# Patient Record
Sex: Male | Born: 2000 | Race: Black or African American | Hispanic: No | Marital: Single | Smoking: Never smoker
Health system: Southern US, Community
[De-identification: ages and names within clinical notes are randomized; demographics above are authoritative.]

## PROBLEM LIST (undated history)

## (undated) DIAGNOSIS — J45909 Unspecified asthma, uncomplicated: Secondary | ICD-10-CM

## (undated) HISTORY — PX: TYMPANOSTOMY TUBE PLACEMENT: SHX32

## (undated) HISTORY — PX: TYMPANOPLASTY: SHX33

---
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
## 2022-02-01 ENCOUNTER — Ambulatory Visit (HOSPITAL_COMMUNITY)
Admission: EM | Admit: 2022-02-01 | Discharge: 2022-02-01 | Disposition: A | Discharge status: Home / Self Care | Payer: Medicaid Other | Attending: Emergency Medicine | Admitting: Emergency Medicine

## 2022-02-01 ENCOUNTER — Other Ambulatory Visit: Payer: Self-pay

## 2022-02-01 ENCOUNTER — Encounter (HOSPITAL_COMMUNITY): Payer: Self-pay | Admitting: *Deleted

## 2022-02-01 DIAGNOSIS — M25562 Pain in left knee: Secondary | ICD-10-CM

## 2022-02-01 MED ORDER — IBUPROFEN 800 MG PO TABS: 800.0000 mg | ORAL_TABLET | Freq: Three times a day (TID) | ORAL | 0 refills | Status: DC

## 2022-02-01 NOTE — ED Triage Notes (Signed)
Pt reports knee pain LT. Pt ha san appt with ortho on Friday but wants to be seen now. ?

## 2022-02-01 NOTE — ED Provider Notes (Addendum)
?[facility] ? ? ?MRN: [id number] DOB: [birth year] ? ?Subjective:  ? ?Chief Complaint;  ?Chief Complaint  ?Patient presents with  ? Knee Pain  ? ? ?Michael Davies is a 21 y.o. male presenting for left knee pain.  There was no precipitating event however patient several years ago did have a partial tear of his ACL.  Over the last couple of days he has had sharp shooting pains from his knee into his anterior calf he denies instability. ? ?No current facility-administered medications for this encounter. ? ?Current Outpatient Medications:  ?  albuterol (PROVENTIL HFA;VENTOLIN HFA) 108 (90 Base) MCG/ACT inhaler, Inhale 1-2 puffs into the lungs every 6 (six) hours as needed for wheezing or shortness of breath., Disp: 1 Inhaler, Rfl: 0 ?  beclomethasone (QVAR) 40 MCG/ACT inhaler, Inhale into the lungs 2 (two) times daily., Disp: , Rfl:  ?  cetirizine (ZYRTEC) 10 MG tablet, Take 10 mg by mouth daily., Disp: , Rfl:   ? ?No Known Allergies ? ?Past Medical History:  ?Diagnosis Date  ? Asthma   ?  ? ?Review of Systems  ?All other systems reviewed and are negative. ? ? ?Objective:  ? ?Vitals: ?BP (!) 90/56   Pulse 66   Temp 98.6 ?F (37 ?C)   Resp 20   SpO2 98%  ? ?Physical Exam ?Vitals and nursing note reviewed.  ?Constitutional:   ?   General: He is not in acute distress. ?   Appearance: He is well-developed.  ?HENT:  ?   Head: Normocephalic and atraumatic.  ?Eyes:  ?   Conjunctiva/sclera: Conjunctivae normal.  ?Cardiovascular:  ?   Rate and Rhythm: Normal rate and regular rhythm.  ?   Heart sounds: No murmur heard. ?Pulmonary:  ?   Effort: Pulmonary effort is normal. No respiratory distress.  ?   Breath sounds: Normal breath sounds.  ?Abdominal:  ?   Palpations: Abdomen is soft.  ?   Tenderness: There is no abdominal tenderness.  ?Musculoskeletal:     ?   General: Tenderness present. No swelling.  ?   Cervical back: Neck supple.  ?   Comments: Left knee with lateral tenderness to palpation, no gross  swelling, ballottement, deformity.  Full range of motion, no NVD D, McMurray and drawer are negative.  ?Skin: ?   General: Skin is warm and dry.  ?   Capillary Refill: Capillary refill takes less than 2 seconds.  ?Neurological:  ?   Mental Status: He is alert.  ?Psychiatric:     ?   Mood and Affect: Mood normal.  ? ? ?No results found for this or any previous visit (from the past 24 hour(s)). ? ?No results found.  ?  ? ?Assessment and Plan :  ? ?1. Acute pain of left knee   ? ? ?No orders of the defined types were placed in this encounter. ? ? ?MDM:  ?Michael Davies is a 21 y.o. male presenting for left knee pain.  He had previous ACL tear but increased pain over the last few days without new trauma.  His physical exam is unremarkable except for mild tenderness to the lateral aspect.  Patient is encouraged to use Motrin 3 times a day and use some support on the ligament.  He has follow-up with Ortho this month for consideration PT and/or MRI.  I discussed treatment, follow up and return instructions. Questions were answered. Patient stated understanding of instructions and is stable for discharge. ? ?Elnita Maxwell  Renard Matter FNP-C MCN  ?  ?Jone Baseman, NP ?02/01/22 1705 ? ?  ?Jone Baseman, NP ?02/01/22 1706 ? ?

## 2022-02-01 NOTE — Discharge Instructions (Addendum)
Take Motrin as directed 3 times daily and use support on the left knee.  Follow-up with orthopedics as scheduled for further evaluation if not improving for consideration MRI versus physical therapy. ?

## 2022-02-03 ENCOUNTER — Encounter: Payer: Self-pay | Admitting: Physician Assistant

## 2022-02-03 ENCOUNTER — Ambulatory Visit (INDEPENDENT_AMBULATORY_CARE_PROVIDER_SITE_OTHER): Payer: Medicaid Other | Admitting: Physician Assistant

## 2022-02-03 ENCOUNTER — Other Ambulatory Visit: Payer: Self-pay

## 2022-02-03 ENCOUNTER — Ambulatory Visit (INDEPENDENT_AMBULATORY_CARE_PROVIDER_SITE_OTHER): Payer: Medicaid Other

## 2022-02-03 DIAGNOSIS — M25562 Pain in left knee: Secondary | ICD-10-CM

## 2022-02-03 DIAGNOSIS — G8929 Other chronic pain: Secondary | ICD-10-CM

## 2022-02-03 NOTE — Progress Notes (Addendum)
Office Visit Note   Patient: Michael Davies           Date of Birth: 12/18/2000           MRN: [id number] Visit Date: 02/03/2022              Requested by: [facility] 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  He states that when he was a freshman in high school he had a significant injury to his left knee.  He had a big effusion and thinks he "dislocated his knee "an MRI 5 years ago demonstrated per his report partial tearing of the ACL and a lateral meniscus tear.  Because of his age he went through conservative treatment and continues to go through conservative treatment including physical therapy exercises and bracing.  He now states the knee pain especially laterally is getting much worse.  His knee feels unstable.  And he has a palpable popping when he bends and extends his knee.  He would like to be more active but cannot be because of this knee.  He believes he was initially treated at [facility] orth[facility]  Assessment & Plan: Visit Diagnoses:  1. Chronic pain of left knee     Plan: 21 year old with history of knee instability and by report meniscus tear.  It was decided at the time surgery would not be appropriate because of his age.  He has had increasing pain and mechanical system such as locking and catching in the knee.  Exam coincides with this.  He also is beginning to get more pain and instability.  I have recommended an MRI of the left knee and follow-up with Dr. August Saucer.  Again he has failed conservative treatment including physical therapy continued exercise anti-inflammatories and bracing  Follow-Up Instructions: No follow-ups on file.   Ortho Exam  Patient is alert, oriented, no adenopathy, well-dressed, normal affect, normal respiratory effort. Examination of his left knee he  does have slight increased swelling compared to the right knee.  No significant effusion no redness no cellulitis.  With range of motion he has painful popping on the lateral side of the joint line.  Some tenderness around the patellofemoral joint though most everything is focused laterally good varus valgus stability.  Has a fairly good endpoint on anterior draw and compared to the unaffected side.  Distal CMS is intact  Imaging: XR KNEE 3 VIEW LEFT  Result Date: 02/03/2022 Radiographs of his left knee were reviewed today well-maintained alignment and joint spacing no acute osseous injuries.  He does have a small loose body off the patella but this is not acute.  No images are attached to the encounter.  Labs: Lab Results  Component Value Date   REPTSTATUS 11/20/2016 FINAL 11/17/2016   CULT NO GROUP A STREP (S.PYOGENES) ISOLATED 11/17/2016     No results found for: ALBUMIN, PREALBUMIN, CBC  No results found for: MG No results found for: VD25OH  No results found for: PREALBUMIN No flowsheet data found.   There is no height or weight on file to calculate BMI.  Orders:  Orders Placed This Encounter  Procedures   XR KNEE 3 VIEW LEFT   MR Knee Left w/o contrast   Ambulatory referral to Orthopedic Surgery   No orders of the  defined types were placed in this encounter.    Procedures: No procedures performed  Clinical Data: No additional findings.  ROS:  All other systems negative, except as noted in the HPI. Review of Systems  Objective: Vital Signs: There were no vitals taken for this visit.  Specialty Comments:  No specialty comments available.  PMFS History: There are no problems to display for this patient.  Past Medical History:  Diagnosis Date   Asthma     No family history on file.  Past Surgical History:  Procedure Laterality Date   TYMPANOPLASTY     TYMPANOSTOMY TUBE PLACEMENT     Social History   Occupational History   Not on file  Tobacco Use    Smoking status: Never   Smokeless tobacco: Never  Substance and Sexual Activity   Alcohol use: No   Drug use: Not on file   Sexual activity: Not on file

## 2022-02-17 ENCOUNTER — Telehealth: Payer: Self-pay | Admitting: Physician Assistant

## 2022-02-17 NOTE — Telephone Encounter (Signed)
Contacted patient his MRI of his knee was denied giving his past history and his locking and swelling in his knee I do think it is medically necessary.  He does have a follow-up appointment with Dr. August Saucer on Monday and I asked that he keep this so Dr. August Saucer can evaluate his knee ?

## 2022-02-18 ENCOUNTER — Other Ambulatory Visit: Payer: Medicaid Other

## 2022-02-20 ENCOUNTER — Ambulatory Visit: Payer: Medicaid Other | Admitting: Orthopedic Surgery

## 2022-04-18 ENCOUNTER — Telehealth: Payer: Self-pay | Admitting: *Deleted

## 2022-04-18 NOTE — Telephone Encounter (Signed)
Pt called stating he had received authorization from his insurance and would like to proceed with getting MRI, I told pt I never received auth from insurance and asked if he could bring me a copy to review and try to get scheduled with imaging.  ? ?Pt brought in the authorizatoin form I looked over it and noticed the validity dates were from 02/03/22-03/05/22, I advised pt I will have to contact [employer] to see if I could get it extended.  ? ?I called pt insurance I was informed the approval turnaround is still in prospective and once it gets approved on their end then I could go in and change the DOS if needed or I could withdraw the approval and start again. I got online to see if I could restart a new case and it shows pt is no longer eligible in their system.  ? ?I will wait a couple weeks and try again to see if can get the valid dates changed. Number to call [phone] ? ?Call reference to this case [id number] ?

## 2022-05-12 ENCOUNTER — Ambulatory Visit
Admission: RE | Admit: 2022-05-12 | Discharge: 2022-05-12 | Disposition: A | Discharge status: Home / Self Care | Payer: Medicaid Other | Source: Ambulatory Visit | Attending: Physician Assistant | Admitting: Physician Assistant

## 2022-05-12 DIAGNOSIS — M25562 Pain in left knee: Secondary | ICD-10-CM

## 2022-05-12 NOTE — Telephone Encounter (Signed)
Pt has been approved

## 2022-05-15 ENCOUNTER — Other Ambulatory Visit: Payer: Self-pay | Admitting: Physician Assistant

## 2022-06-05 ENCOUNTER — Ambulatory Visit (INDEPENDENT_AMBULATORY_CARE_PROVIDER_SITE_OTHER): Payer: Medicaid Other | Admitting: Orthopedic Surgery

## 2022-06-05 DIAGNOSIS — G8929 Other chronic pain: Secondary | ICD-10-CM | POA: Diagnosis not present

## 2022-06-05 DIAGNOSIS — M25562 Pain in left knee: Secondary | ICD-10-CM | POA: Diagnosis not present

## 2022-06-10 ENCOUNTER — Encounter: Payer: Self-pay | Admitting: Orthopedic Surgery

## 2022-06-10 NOTE — Progress Notes (Signed)
Office Visit Note   Patient: Michael Davies           Date of Birth: 2001/01/04           MRN: [id number] Visit Date: 06/05/2022 Requested by: [facility] [address] [place],  [place] [address] PCP: [facility]  Subjective: Chief Complaint  Patient presents with   Left Knee - Pain    Scan review    HPI: Patient is a 21 year old who presents for evaluation of left knee pain.  He has had pain for several years.  Describes prior patellar dislocation and meniscal tear with no surgery done.  Pain comes and goes and is worse with activity.  Sometimes he has no pain.  The knee never swells.  Patient reports some popping in the front and the back of the knee.  He states the pain at times is difficult to bear.  Hard for him to play basketball.  Most of the time when he is symptomatic it occurs on that lateral side.  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  MRI scan of the left knee from June 2023 is reviewed.  It does show some signal in the medial meniscus with no discrete tear.  Also signal in the lateral meniscus with possible degenerative tear.  He may have a focal full-thickness cartilage defect with no subchondral marrow edema on the lateral femoral condyle.  Again these findings are somewhat underwhelming on review of the images.              ROS: All systems reviewed are negative as they relate to the chief complaint within the history of present illness.  Patient denies  fevers or chills.   Assessment & Plan: Visit Diagnoses:  1. Chronic pain of left knee     Plan: Impression is mild left knee pain with some degenerative signal in the meniscus laterally which may or may not reach the articular surface.  Small chondral defect also noted which has no reactive subchondral marrow edema.  He never has any swelling in the knee.  I think he does have some pain which may or may not be related to these MRI findings.   Whether or not arthroscopic intervention would be predictably helpful to get him back to 100% which is what his desire is is difficult to say.  I think it is unlikely.  However if he wants to proceed with surgical intervention that remains an option but he is going to hold off for now.  We will see him back as needed.  Follow-Up Instructions: Return if symptoms worsen or fail to improve.   Orders:  No orders of the defined types were placed in this encounter.  No orders of the defined types were placed in this encounter.     Procedures: No procedures performed   Clinical Data: No additional findings.  Objective: Vital Signs: There were no vitals taken for this visit.  Physical Exam:   Constitutional: Patient appears well-developed HEENT:  Head: Normocephalic Eyes:EOM are normal Neck: Normal range of motion Cardiovascular: Normal rate Pulmonary/chest: Effort normal Neurologic: Patient is alert Skin: Skin is warm Psychiatric: Patient has normal mood and affect   Ortho Exam: Ortho exam demonstrates full active and passive range of motion of the left knee.  There is no lateral or medial joint line tenderness.  No patellar apprehension.  Collateral crucial ligaments are stable.  Range  of motion is full.  Alignment intact.  No patellofemoral crepitus.  McMurray compression testing is negative for both sides.  Specialty Comments:  No specialty comments available.  Imaging: No results found.   PMFS History: There are no problems to display for this patient.  Past Medical History:  Diagnosis Date   Asthma     History reviewed. No pertinent family history.  Past Surgical History:  Procedure Laterality Date   TYMPANOPLASTY     TYMPANOSTOMY TUBE PLACEMENT     Social History   Occupational History   Not on file  Tobacco Use   Smoking status: Never   Smokeless tobacco: Never  Substance and Sexual Activity   Alcohol use: No   Drug use: Not on file   Sexual  activity: Not on file

## 2023-02-10 IMAGING — MR MR KNEE*L* W/O CM
4 of 7 series · 22 of 40 positions shown · non-contrast
Comparison: Left knee radiographs 02/03/2022

CLINICAL DATA: Chronic left knee pain and weakness. Evaluate for
meniscal injury.

EXAM:
MRI OF THE LEFT KNEE WITHOUT CONTRAST
TECHNIQUE: Multiplanar, multisequence MR imaging of the knee was performed. No
intravenous contrast was administered.

[Series 3: T2 fat-sat · axial · 4.0mm · 0.62mm/px · z∈[-88,+57]mm · 6 of 30 slices shown]
[im 1/30]
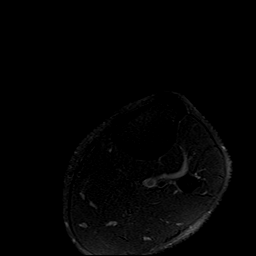
[im 6/30]
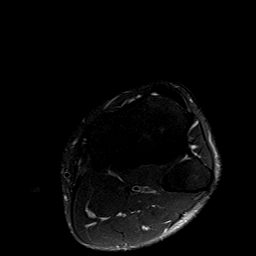
[im 12/30]
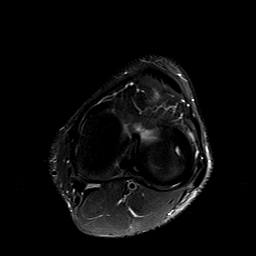
[im 18/30]
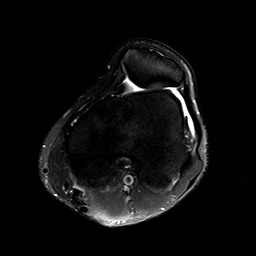
[im 24/30]
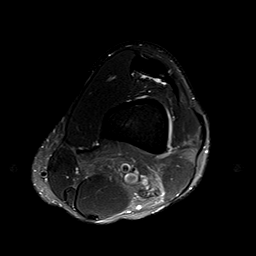
[im 30/30]
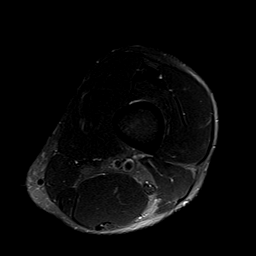

[Series 6: PD fat-sat · coronal · 3.0mm · 0.29mm/px · 7 of 32 slices shown (1 of 3)]
[im 1/32]
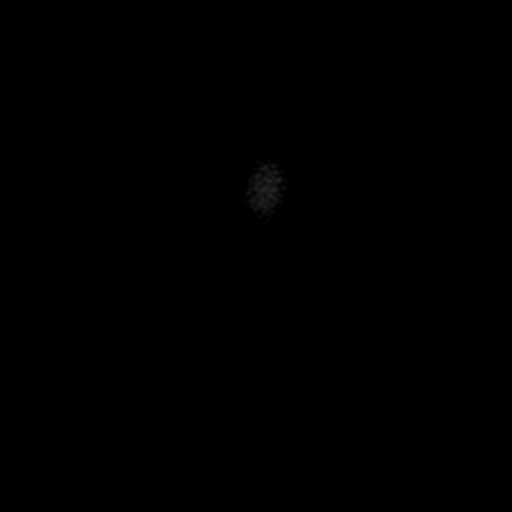
[im 6/32]
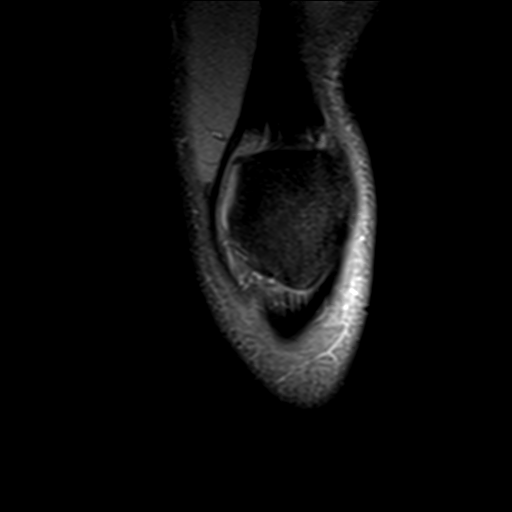
[im 11/32]
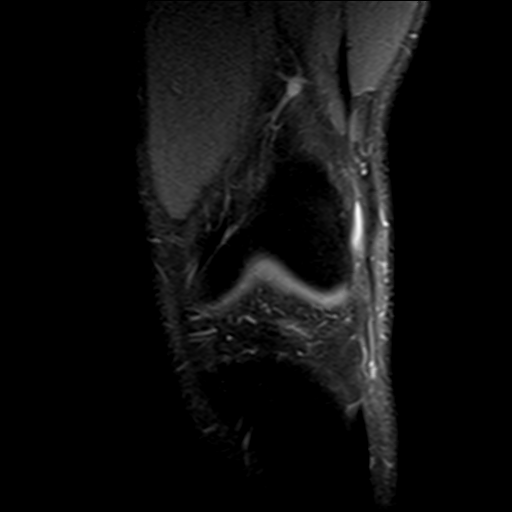
[im 16/32]
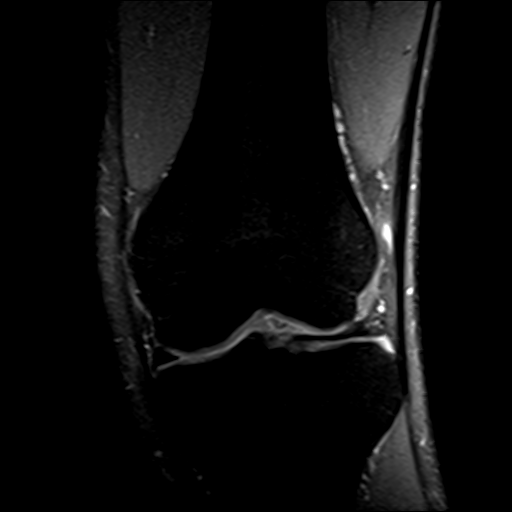
[im 21/32]
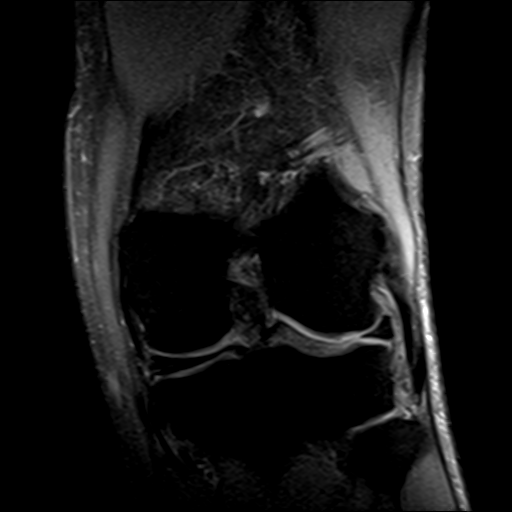
[im 26/32]
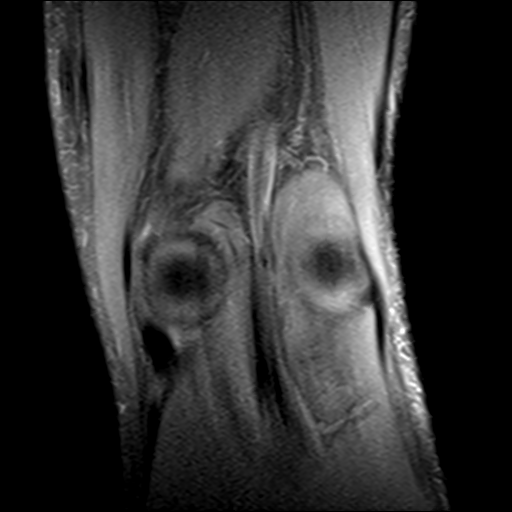
[im 32/32]
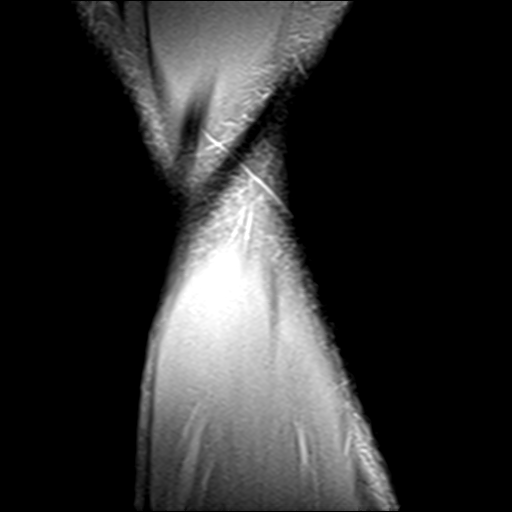

[Series 8: PD fat-sat · sagittal · 3.0mm · 0.29mm/px · 6 of 29 slices shown (2 of 3)]
[im 1/29]
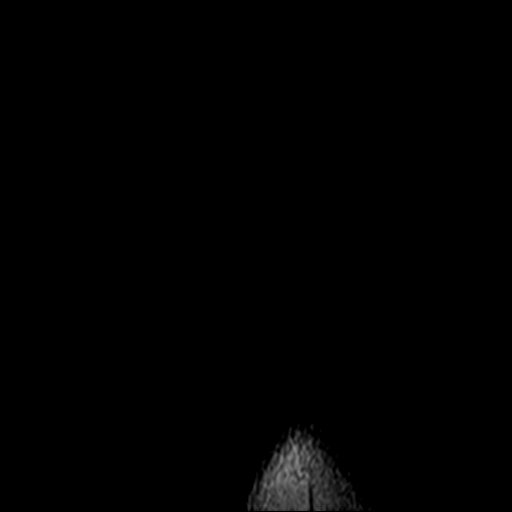
[im 6/29]
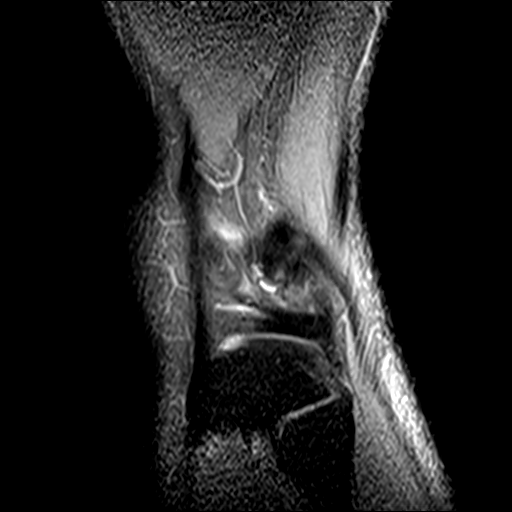
[im 12/29]
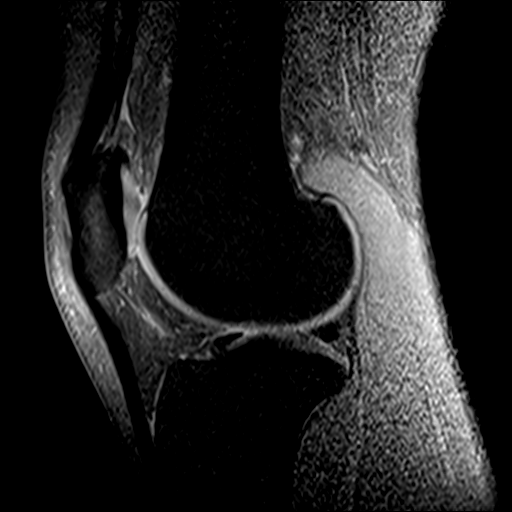
[im 17/29]
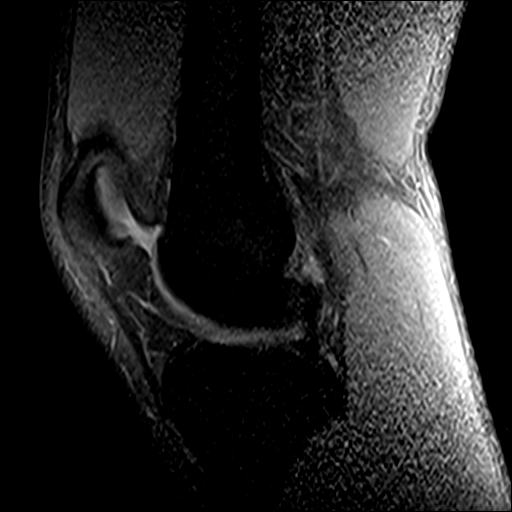
[im 23/29]
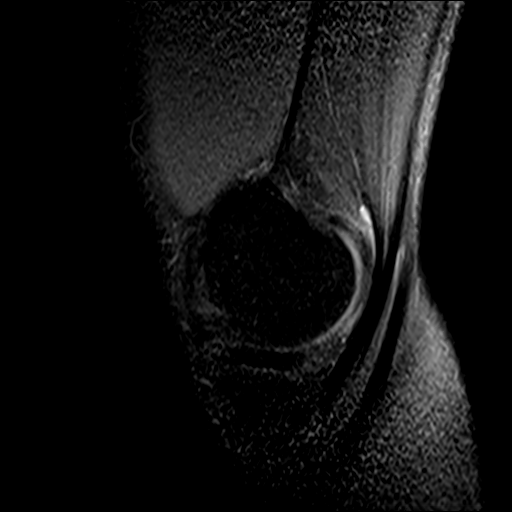
[im 29/29]
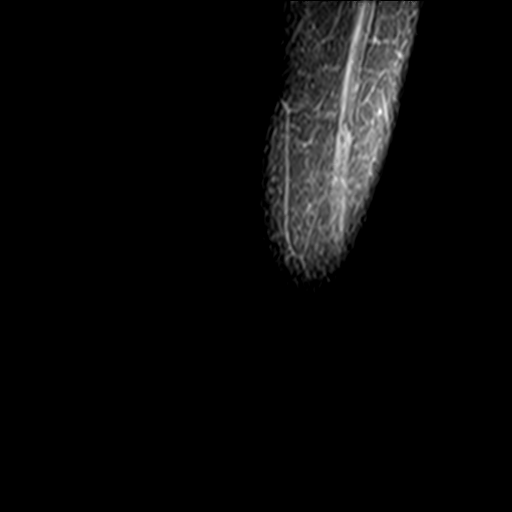

[Series 9: PD fat-sat · coronal · 2.3mm · 0.29mm/px · 3 of 12 slices shown (3 of 3)]
[im 1/12]
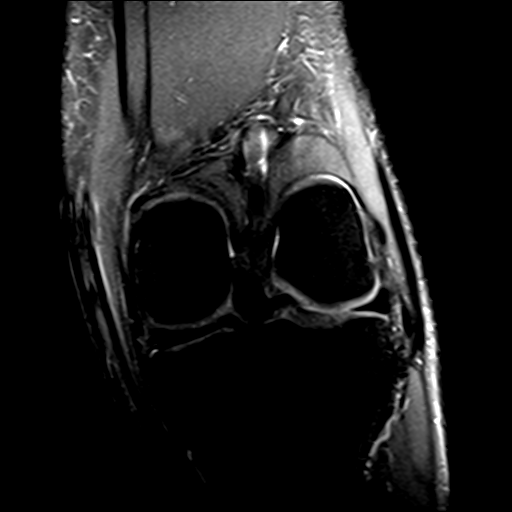
[im 6/12]
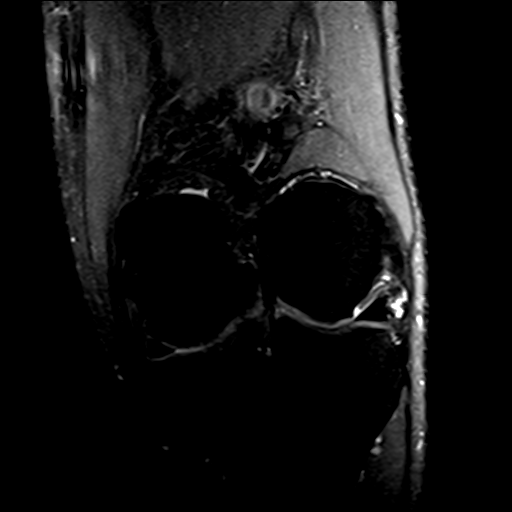
[im 12/12]
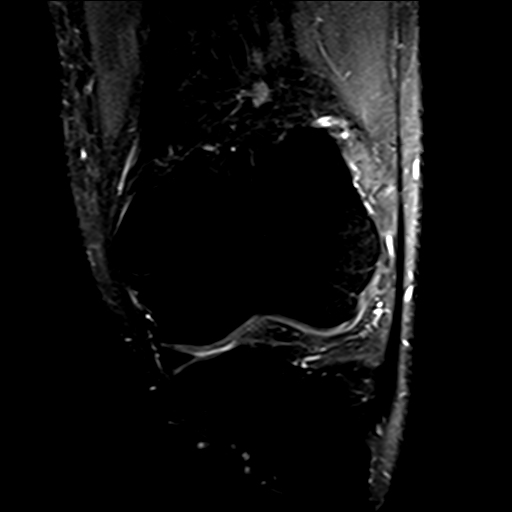

[22 of 40 positions shown; findings below may reference images not displayed]

FINDINGS: MENISCI

Medial meniscus: Within the limitations of decreased signal to
noise, there is horizontal linear increased proton density signal
within the posterior horn and posterior segment of the body of the
medial meniscus (sagittal series 8 images 20 through 23 and coronal
series 6 images 12 through 14 without definite extension to an
articular surface to indicate a articular surface tear.

Lateral meniscus: There is abnormal intermediate proton density
signal within the middle and central thirds of the meniscal triangle
of the junction of the anterior horn and body of the lateral
meniscus (coronal series 6, images 16 and 17, sagittal series 8
image 8). This appears to represent degenerative change with a small
degenerative tear extending through the superior articular surface
(coronal series 6, image 16).

LIGAMENTS

Cruciates: The ACL and PCL are intact.

Collaterals: Minimal intermediate T2 signal within the proximal
anterior aspect of the medial collateral ligament (axial series 2,
image 16), likely minimal remote injury/chronic sprain. No fluid
bright tear. The fibular collateral ligament, biceps femoris tendon,
iliotibial band, and popliteus tendon are intact.

CARTILAGE

Patellofemoral:  Intact.

Medial:  Intact.

Lateral: There is a tiny fluid bright focus at the far lateral
aspect of the slightly anterior portion of the weight-bearing
lateral femoral condyle cartilage (coronal image 12 sagittal image
8, and axial image 19), a focal likely full-thickness cartilage
defect. No subchondral marrow edema. The lateral tibial plateau
cartilage is intact.

Joint: Nojoint effusion. Normal Hoffa's fat pad. No plical
thickening.

Popliteal Fossa:  No Baker's cyst.

Extensor Mechanism: Minimal intermediate T2 signal tendinosis of the
quadriceps tendon insertion. The patellar tendon is intact.

Bones:  No acute fracture or dislocation.

Other: None.
IMPRESSION: 1. Intermediate signal intrasubstance degeneration within the
posterior horn and posterior segment of the body of the medial
meniscus without a definite tear extending through the articular
surface.
2. Degenerative change of the junction of the anterior horn and body
of the lateral meniscus with likely small degenerative tear
extending through the superior articular surface.
3. Tiny fluid bright focus at the far lateral aspect of the slightly
anterior portion of the weight-bearing lateral femoral condyle, a
focal likely full-thickness cartilage defect.

## 2024-03-10 ENCOUNTER — Encounter (HOSPITAL_COMMUNITY): Payer: Self-pay

## 2024-03-10 ENCOUNTER — Ambulatory Visit (HOSPITAL_COMMUNITY): Admission: EM | Admit: 2024-03-10 | Discharge: 2024-03-10 | Disposition: A | Discharge status: Home / Self Care

## 2024-03-10 DIAGNOSIS — S63501A Unspecified sprain of right wrist, initial encounter: Secondary | ICD-10-CM | POA: Diagnosis not present

## 2024-03-10 DIAGNOSIS — S63502A Unspecified sprain of left wrist, initial encounter: Secondary | ICD-10-CM | POA: Diagnosis not present

## 2024-03-10 NOTE — Discharge Instructions (Addendum)
Return if any problems.  Ibuprofen for discomfort 
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
